# Patient Record
Sex: Female | Born: 1975 | Race: White | Hispanic: No | Marital: Married | Smoking: Never smoker
Health system: Southern US, Community
[De-identification: ages and names within clinical notes are randomized; demographics above are authoritative.]

## PROBLEM LIST (undated history)

## (undated) DIAGNOSIS — K8020 Calculus of gallbladder without cholecystitis without obstruction: Secondary | ICD-10-CM

---

## 2018-09-25 ENCOUNTER — Other Ambulatory Visit: Payer: Self-pay | Admitting: Obstetrics and Gynecology

## 2018-09-25 DIAGNOSIS — Z1231 Encounter for screening mammogram for malignant neoplasm of breast: Secondary | ICD-10-CM

## 2018-11-09 ENCOUNTER — Ambulatory Visit (INDEPENDENT_AMBULATORY_CARE_PROVIDER_SITE_OTHER): Payer: PRIVATE HEALTH INSURANCE | Admitting: Mental Health

## 2018-11-09 ENCOUNTER — Encounter: Payer: Self-pay | Admitting: Mental Health

## 2018-11-09 ENCOUNTER — Encounter (INDEPENDENT_AMBULATORY_CARE_PROVIDER_SITE_OTHER): Payer: Self-pay

## 2018-11-09 DIAGNOSIS — F411 Generalized anxiety disorder: Secondary | ICD-10-CM | POA: Diagnosis not present

## 2018-11-09 DIAGNOSIS — F509 Eating disorder, unspecified: Secondary | ICD-10-CM | POA: Diagnosis not present

## 2018-11-09 NOTE — Progress Notes (Signed)
[facility] Initial Adult Exam- Part I  Name: Kim Wong Date: 11/09/2018 MRN: [id number] DOB: April 27, 1976 PCP: Kim Moccasin, MD  Time spent: 50 minutes   Guardian/Payee: patient   Paperwork requested:  No   Reason for Visit /Presenting Problem:  Anxiety, lack of self care  Mental Status Exam:   Appearance:   Well Groomed     Behavior:  Appropriate and Sharing  Motor:  Normal  Speech/Language:   Normal Rate  Affect:  anxious, overwhelmed  Mood:  anxious  Thought process:  normal  Thought content:    WNL  Sensory/Perceptual disturbances:    WNL  Orientation:  oriented to person, place and time/date  Attention:  Good  Concentration:  Good  Memory:  WNL  Fund of knowledge:   Good  Insight:    Good  Judgment:   Good  Impulse Control:  Fair   Reported Symptoms:  Feelings of Worthlessness, Hopelessness, Appetite disturbance, Fatigue and self rejection  Risk Assessment: Danger to Self:  No Self-injurious Behavior: No Danger to Others: No Duty to Warn:no Physical Aggression / Violence:No  Access to Firearms a concern: No  Gang Involvement:No  Patient / guardian was educated about steps to take if suicide or homicide risk level increases between visits: no While future psychiatric events cannot be accurately predicted, the patient does not currently require acute inpatient psychiatric care and does not currently meet [facility] involuntary commitment criteria.  Substance Abuse History: Current substance abuse: No     Past Psychiatric History:   No previous psychological problems have been observed Outpatient Providers:unknown History of Psych Hospitalization: No  Psychological Testing: none    Medical History/Surgical History:reviewed No past medical history on file.    Medications: No current outpatient medications on file.   No current facility-administered medications for this visit.     Allergies not on file  Diagnoses:   ICD-10-CM   1. Generalized anxiety disorder F41.1   2. Eating disorder, unspecified type F50.9      Part II to be continued at next session:   No.   Kim Wong, LPC        Abuse History: Victim: none Report needed: no Perpetrator of abuse: no Witness / Exposure to Domestic Violence:  none Protective Services Involvement: no Witness to [employer] Violence:  no   Family / Social History:    Living situation: in home with husband and children Sexual Orientation: straight Relationship Status:   Married Name of spouse / other: Kim Wong If a parent, number of children / ages:   Three :  21, 17, 13  Support Systems: Family, Location manager Stress:   routine  Income/Employment/Disability:   Engineer, maintenance (IT): None  Educational History:   Actor online currently  Religion/Sprituality/World View:    Christian  Any cultural differences that may affect / interfere with treatment:  None  Recreation/Hobbies:  None  Stressors:  Obesity, slef concept  Strengths:  Faith, family  Barriers: none  Legal History: None  Pending legal issue / charges: none  History of legal issue / charges: none   Diagnosis:  Generalized Anxiety                    Eating Disorder

## 2018-11-27 ENCOUNTER — Ambulatory Visit: Payer: PRIVATE HEALTH INSURANCE | Admitting: Mental Health

## 2018-12-13 ENCOUNTER — Ambulatory Visit: Payer: PRIVATE HEALTH INSURANCE | Admitting: Mental Health

## 2018-12-17 ENCOUNTER — Ambulatory Visit: Payer: PRIVATE HEALTH INSURANCE | Admitting: Mental Health

## 2019-01-04 ENCOUNTER — Other Ambulatory Visit: Payer: Self-pay

## 2019-01-04 ENCOUNTER — Encounter (HOSPITAL_BASED_OUTPATIENT_CLINIC_OR_DEPARTMENT_OTHER): Payer: Self-pay

## 2019-01-04 ENCOUNTER — Emergency Department (HOSPITAL_BASED_OUTPATIENT_CLINIC_OR_DEPARTMENT_OTHER): Payer: PRIVATE HEALTH INSURANCE

## 2019-01-04 ENCOUNTER — Observation Stay (HOSPITAL_BASED_OUTPATIENT_CLINIC_OR_DEPARTMENT_OTHER)
Admission: EM | Admit: 2019-01-04 | Discharge: 2019-01-05 | Disposition: A | Discharge status: Home / Self Care | Payer: PRIVATE HEALTH INSURANCE | Attending: General Surgery | Admitting: General Surgery

## 2019-01-04 DIAGNOSIS — Z6841 Body Mass Index (BMI) 40.0 and over, adult: Secondary | ICD-10-CM | POA: Insufficient documentation

## 2019-01-04 DIAGNOSIS — K8000 Calculus of gallbladder with acute cholecystitis without obstruction: Principal | ICD-10-CM | POA: Insufficient documentation

## 2019-01-04 DIAGNOSIS — K810 Acute cholecystitis: Secondary | ICD-10-CM

## 2019-01-04 DIAGNOSIS — K8010 Calculus of gallbladder with chronic cholecystitis without obstruction: Secondary | ICD-10-CM | POA: Diagnosis present

## 2019-01-04 DIAGNOSIS — R109 Unspecified abdominal pain: Secondary | ICD-10-CM

## 2019-01-04 DIAGNOSIS — K819 Cholecystitis, unspecified: Secondary | ICD-10-CM

## 2019-01-04 HISTORY — DX: Calculus of gallbladder without cholecystitis without obstruction: K80.20

## 2019-01-04 LAB — COMPREHENSIVE METABOLIC PANEL
ALT: 70 U/L — ABNORMAL HIGH (ref 0–44)
AST: 174 U/L — AB (ref 15–41)
Albumin: 3.6 g/dL (ref 3.5–5.0)
Alkaline Phosphatase: 152 U/L — ABNORMAL HIGH (ref 38–126)
Anion gap: 8 (ref 5–15)
BUN: 10 mg/dL (ref 6–20)
CO2: 21 mmol/L — ABNORMAL LOW (ref 22–32)
Calcium: 8.9 mg/dL (ref 8.9–10.3)
Chloride: 105 mmol/L (ref 98–111)
Creatinine, Ser: 0.66 mg/dL (ref 0.44–1.00)
GFR calc Af Amer: >60 mL/min (ref >60)
GFR calc non Af Amer: >60 mL/min (ref >60)
Glucose, Bld: 150 mg/dL — ABNORMAL HIGH (ref 70–99)
POTASSIUM: 3.4 mmol/L — AB (ref 3.5–5.1)
Sodium: 134 mmol/L — ABNORMAL LOW (ref 135–145)
Total Bilirubin: 0.7 mg/dL (ref 0.3–1.2)
Total Protein: 7.5 g/dL (ref 6.5–8.1)

## 2019-01-04 LAB — CBC WITH DIFFERENTIAL/PLATELET
Abs Immature Granulocytes: 0.03 10*3/uL (ref 0.00–0.07)
Basophils Absolute: 0 10*3/uL (ref 0.0–0.1)
Basophils Relative: 0 %
EOS ABS: 0 10*3/uL (ref 0.0–0.5)
Eosinophils Relative: 0 %
HEMATOCRIT: 38.6 % (ref 36.0–46.0)
Hemoglobin: 12 g/dL (ref 12.0–15.0)
Immature Granulocytes: 0 %
LYMPHS ABS: 1.1 10*3/uL (ref 0.7–4.0)
Lymphocytes Relative: 10 %
MCH: 25.4 pg — ABNORMAL LOW (ref 26.0–34.0)
MCHC: 31.1 g/dL (ref 30.0–36.0)
MCV: 81.6 fL (ref 80.0–100.0)
MONOS PCT: 3 %
Monocytes Absolute: 0.3 10*3/uL (ref 0.1–1.0)
Neutro Abs: 9.2 10*3/uL — ABNORMAL HIGH (ref 1.7–7.7)
Neutrophils Relative %: 87 %
Platelets: 305 10*3/uL (ref 150–400)
RBC: 4.73 MIL/uL (ref 3.87–5.11)
RDW: 14.8 % (ref 11.5–15.5)
WBC: 10.7 10*3/uL — ABNORMAL HIGH (ref 4.0–10.5)
nRBC: 0 % (ref 0.0–0.2)

## 2019-01-04 LAB — LIPASE, BLOOD: Lipase: 20 U/L (ref 11–51)

## 2019-01-04 LAB — URINALYSIS, ROUTINE W REFLEX MICROSCOPIC
Bilirubin Urine: NEGATIVE
Glucose, UA: NEGATIVE mg/dL
Hgb urine dipstick: NEGATIVE
Ketones, ur: NEGATIVE mg/dL
Nitrite: NEGATIVE
Protein, ur: NEGATIVE mg/dL
Specific Gravity, Urine: 1.015 (ref 1.005–1.030)
pH: 8.5 — ABNORMAL HIGH (ref 5.0–8.0)

## 2019-01-04 LAB — URINALYSIS, MICROSCOPIC (REFLEX)

## 2019-01-04 LAB — PREGNANCY, URINE: Preg Test, Ur: NEGATIVE

## 2019-01-04 MED ORDER — FENTANYL CITRATE (PF) 100 MCG/2ML IJ SOLN: 100.0000 ug | Freq: Once | INTRAMUSCULAR | Status: DC

## 2019-01-04 MED ORDER — ONDANSETRON HCL 4 MG/2ML IJ SOLN
4.0000 mg | Freq: Once | INTRAMUSCULAR | Status: AC | PRN
  Administered 2019-01-04: 4 mg via INTRAVENOUS
  Filled 2019-01-04: qty 2

## 2019-01-04 MED ORDER — SODIUM CHLORIDE 0.9 % IV SOLN
2.0000 g | Freq: Once | INTRAVENOUS | Status: AC
  Administered 2019-01-04: 2 g via INTRAVENOUS
  Filled 2019-01-04: qty 20

## 2019-01-04 MED ORDER — SODIUM CHLORIDE 0.9 % IV SOLN
INTRAVENOUS | Status: DC | PRN
  Administered 2019-01-04: 250 mL via INTRAVENOUS

## 2019-01-04 NOTE — ED Provider Notes (Signed)
[facility] EMERGENCY DEPARTMENT Provider Note   CSN: [id number] Arrival date & time: 01/04/19  [place]    History   Chief Complaint Chief Complaint  Patient presents with  . Abdominal Pain    HPI Kim Wong is a 43 y.o. female medical history of gallstones who presents for evaluation of right upper quadrant abdominal pain that began about 3 PM.  Patient with a known history of gallstones.  She states she will occasionally have intermittent attacks but states that they are usually manageable.  She states that about 3 PM this evening, she started developing some right upper quadrant abdominal pain.  She states that this was worse than her previous pain.  She took ibuprofen no improvement.  She took a 1 Vicodin that she had leftover.  She states that this brought her pain down from a 9 to about a 7.  She states she called the telemedicine service who prompted her to the emergency department for further evaluation.  Patient reports that on her way here, she had 3 episodes of nonbloody, nonbilious vomiting.  He states that currently her pain is 4/10.  Patient states she has not had any fevers.  Patient denies any chest pain, difficulty breathing, dysuria, hematuria.     The history is provided by the patient.    Past Medical History:  Diagnosis Date  . Gallstones     There are no active problems to display for this patient.   History reviewed. No pertinent surgical history.   OB History   No obstetric history on file.      Home Medications    Prior to Admission medications   Not on File    Family History No family history on file.  Social History Social History   Tobacco Use  . Smoking status: Never Smoker  . Smokeless tobacco: Never Used  Substance Use Topics  . Alcohol use: Never    Frequency: Never  . Drug use: Never     Allergies   Patient has no known allergies.   Review of Systems Review of Systems  Constitutional: Negative for fever.   Respiratory: Negative for cough and shortness of breath.   Cardiovascular: Negative for chest pain.  Gastrointestinal: Positive for abdominal pain, nausea and vomiting.  Genitourinary: Negative for dysuria and hematuria.  Neurological: Negative for headaches.  All other systems reviewed and are negative.    Physical Exam Updated Vital Signs BP (!) 154/102   Pulse 88   Temp 98.6 F (37 C) (Oral)   Resp 18   Ht _0  (1.676 m)   Wt (!) 154.2 kg   LMP 12/15/2018   SpO2 99%   BMI 54.88 kg/m   Physical Exam Vitals signs and nursing note reviewed.  Constitutional:      Appearance: Normal appearance. She is well-developed.  HENT:     Head: Normocephalic and atraumatic.  Eyes:     General: Lids are normal.     Conjunctiva/sclera: Conjunctivae normal.     Pupils: Pupils are equal, round, and reactive to light.  Neck:     Musculoskeletal: Full passive range of motion without pain.  Cardiovascular:     Rate and Rhythm: Normal rate and regular rhythm.     Pulses: Normal pulses.     Heart sounds: Normal heart sounds. No murmur. No friction rub. No gallop.   Pulmonary:     Effort: Pulmonary effort is normal.     Breath sounds: Normal breath sounds.  Comments: Lungs clear to auscultation bilaterally.  Symmetric chest rise.  No wheezing, rales, rhonchi. Abdominal:     Palpations: Abdomen is soft. Abdomen is not rigid.     Tenderness: There is abdominal tenderness in the right upper quadrant. There is no guarding. Positive signs include Murphy's sign.     Comments: Tenderness noted to right upper quadrant.  Positive Murphy sign.  No tenderness McBurney's point.  No CVA tenderness bilaterally.  Musculoskeletal: Normal range of motion.  Skin:    General: Skin is warm and dry.     Capillary Refill: Capillary refill takes less than 2 seconds.  Neurological:     Mental Status: She is alert and oriented to person, place, and time.  Psychiatric:        Speech: Speech normal.       ED Treatments / Results  Labs (all labs ordered are listed, but only abnormal results are displayed) Labs Reviewed  COMPREHENSIVE METABOLIC PANEL - Abnormal; Notable for the following components:      Result Value   Sodium 134 (*)    Potassium 3.4 (*)    CO2 21 (*)    Glucose, Bld 150 (*)    AST 174 (*)    ALT 70 (*)    Alkaline Phosphatase 152 (*)    All other components within normal limits  URINALYSIS, ROUTINE W REFLEX MICROSCOPIC - Abnormal; Notable for the following components:   pH 8.5 (*)    Leukocytes,Ua TRACE (*)    All other components within normal limits  CBC WITH DIFFERENTIAL/PLATELET - Abnormal; Notable for the following components:   WBC 10.7 (*)    MCH 25.4 (*)    Neutro Abs 9.2 (*)    All other components within normal limits  URINALYSIS, MICROSCOPIC (REFLEX) - Abnormal; Notable for the following components:   Bacteria, UA MANY (*)    All other components within normal limits  LIPASE, BLOOD  PREGNANCY, URINE    EKG None  Radiology US Abdomen Limited Ruq  Result Date: 01/04/2019 CLINICAL DATA:  Epigastric and right upper quadrant pain today. Nausea and vomiting. Known gallstones. EXAM: ULTRASOUND ABDOMEN LIMITED RIGHT UPPER QUADRANT COMPARISON:  None. FINDINGS: Gallbladder: Large shadowing stone in the gallbladder fundus measuring 2.6 cm. Mild gallbladder wall thickening of 3 mm. Small amount pericholecystic fluid. Positive sonographic Murphy sign noted by sonographer. Common bile duct: Diameter: 2 mm. Liver: No focal lesion identified. Within normal limits in parenchymal echogenicity. Portal vein is patent on color Doppler imaging with normal direction of blood flow towards the liver. IMPRESSION: Gallstone in the fundus with mild gallbladder wall thickening, small amount pericholecystic fluid, and positive sonographic Murphy sign. Sonographic findings consistent with acute cholecystitis. No biliary dilatation. Electronically Signed   By: Keith Rake M.D.    On: 01/04/2019 20:18    Procedures Procedures (including critical care time)  Medications Ordered in ED Medications  fentaNYL (SUBLIMAZE) injection 100 mcg (has no administration in time range)  cefTRIAXone (ROCEPHIN) 2 g in sodium chloride 0.9 % 100 mL IVPB (2 g Intravenous New Bag/Given 01/04/19 2047)  0.9 %  sodium chloride infusion (250 mLs Intravenous New Bag/Given 01/04/19 2043)  ondansetron (ZOFRAN) injection 4 mg (4 mg Intravenous Given 01/04/19 2044)     Initial Impression / Assessment and Plan / ED Course  I have reviewed the triage vital signs and the nursing notes.  Pertinent labs & imaging results that were available during my care of the patient were reviewed by me and considered  in my medical decision making (see chart for details).        43 year old female who presents for evaluation of right upper quadrant abdominal pain that began 3 PM this evening.  Patient with known history of gallstones and states she will intermittently have attacks but usually they resolve.  She reports that this seemed more severe.  Also reports some vomiting.  No fevers.  No urinary complaints, chest pain, difficulty breathing. Patient is afebrile, non-toxic appearing, sitting comfortably on examination table. Vital signs reviewed and stable.  Exam, patient with tenderness palpation to the right upper quadrant.  Consider hepatobiliary allergy versus infectious etiology.  We will plan to check labs.  Urine pregnancy negative.  CBC shows leukocytosis of 10.7.  Lipase unremarkable.  CMP shows potassium of 3.4. AST is 174. ALT is 70. Alk Phos is 152. T. Bili is 0.7.  Shows trace hemoglobin.   US shows gallstone in the fundus with mild gallbladder wall thickening. Small amount of pericholecystic fluid and positive sonography Murphy sign.  No biliary dilatation. Findings consistent with acute cholecystitis.    Discussed results with patient. Patient started on Rocephin here in the ED.   Discussed  patient with Dr. Marlou Starks (Gen Surg). He would like patient transferred over to the [facility] ED where he will evaluate patient.   Dr. Laverta Baltimore accepts patient for transfer to the ED. Updated patient on plan. She is agreeable.   Portions of this note were generated with [employer]. Dictation errors may occur despite best attempts at proofreading.   Final Clinical Impressions(s) / ED Diagnoses   Final diagnoses:  Abdominal pain  Acute cholecystitis    ED Discharge Orders    None       Desma Mcgregor 01/04/19 2115    Jola Schmidt, MD 01/04/19 2208

## 2019-01-04 NOTE — H&P (Signed)
Kim Wong is an 43 y.o. female.   Chief Complaint: abd pain HPI: The patient is a 43 year old white female who presents with right upper quadrant pain that started yesterday.  She states that she has known she has had gallstones for at least a couple of years.  She describes the pain is severe.  The pain is been associated with an episode of nausea and vomiting.  She says that she is starting to feel better now.  She denies any fevers or chills.  Her ultrasound showed a large stone in the fundus of the gallbladder with some gallbladder wall thickening.  2 of her 4 liver functions were elevated  Past Medical History:  Diagnosis Date  . Gallstones     History reviewed. No pertinent surgical history.  No family history on file. Social History:  reports that she has never smoked. She has never used smokeless tobacco. She reports that she does not drink alcohol or use drugs.  Allergies: No Known Allergies  (Not in a hospital admission)   Results for orders placed or performed during the hospital encounter of 01/04/19 (from the past 48 hour(s))  Urinalysis, Routine w reflex microscopic     Status: Abnormal   Collection Time: 01/04/19  7:04 PM  Result Value Ref Range   Color, Urine YELLOW YELLOW   APPearance CLEAR CLEAR   Specific Gravity, Urine 1.015 1.005 - 1.030   pH 8.5 (H) 5.0 - 8.0   Glucose, UA NEGATIVE NEGATIVE mg/dL   Hgb urine dipstick NEGATIVE NEGATIVE   Bilirubin Urine NEGATIVE NEGATIVE   Ketones, ur NEGATIVE NEGATIVE mg/dL   Protein, ur NEGATIVE NEGATIVE mg/dL   Nitrite NEGATIVE NEGATIVE   Leukocytes,Ua TRACE (A) NEGATIVE    Comment: Performed at [facility], [address] [place], [place] [address]  Pregnancy, urine     Status: None   Collection Time: 01/04/19  7:04 PM  Result Value Ref Range   Preg Test, Ur NEGATIVE NEGATIVE    Comment:        THE SENSITIVITY OF THIS METHODOLOGY IS >20 mIU/mL. Performed at [facility], [address] [place], [place] [address]   Urinalysis, Microscopic (reflex)     Status: Abnormal   Collection Time: 01/04/19  7:04 PM  Result Value Ref Range   RBC / HPF 0-5 0 - 5 RBC/hpf   WBC, UA 6-10 0 - 5 WBC/hpf   Bacteria, UA MANY (A) NONE SEEN   Squamous Epithelial / LPF 0-5 0 - 5   Mucus PRESENT     Comment: Performed at [facility], [address] [place], [place] [address]  Lipase, blood     Status: None   Collection Time: 01/04/19  7:37 PM  Result Value Ref Range   Lipase 20 11 - 51 U/L    Comment: Performed at [facility], [address] [place], [place] [address]  Comprehensive metabolic panel     Status: Abnormal   Collection Time: 01/04/19  7:37 PM  Result Value Ref Range   Sodium 134 (L) 135 - 145 mmol/L   Potassium 3.4 (L) 3.5 - 5.1 mmol/L   Chloride 105 98 - 111 mmol/L   CO2 21 (L) 22 - 32 mmol/L   Glucose, Bld 150 (H) 70 - 99 mg/dL   BUN 10 6 - 20 mg/dL   Creatinine, Ser 1.74 0.44 - 1.00 mg/dL   Calcium 8.9 8.9 - 08.1 mg/dL  Total Protein 7.5 6.5 - 8.1 g/dL   Albumin 3.6 3.5 - 5.0 g/dL   AST 438 (H) 15 - 41 U/L   ALT 70 (H) 0 - 44 U/L   Alkaline Phosphatase 152 (H) 38 - 126 U/L   Total Bilirubin 0.7 0.3 - 1.2 mg/dL   GFR calc non Af Amer >60 >60 mL/min   GFR calc Af Amer >60 >60 mL/min   Anion gap 8 5 - 15    Comment: Performed at [facility], [address] [place], [place] [address]  CBC with Differential     Status: Abnormal   Collection Time: 01/04/19  7:37 PM  Result Value Ref Range   WBC 10.7 (H) 4.0 - 10.5 K/uL   RBC 4.73 3.87 - 5.11 MIL/uL   Hemoglobin 12.0 12.0 - 15.0 g/dL   HCT 97.2 82.0 - 60.1 %   MCV 81.6 80.0 - 100.0 fL   MCH 25.4 (L) 26.0 - 34.0 pg   MCHC 31.1 30.0 - 36.0 g/dL   RDW 56.1 53.7 - 94.3 %   Platelets 305 150 - 400 K/uL   nRBC 0.0 0.0 - 0.2 %   Neutrophils Relative % 87 %   Neutro Abs 9.2 (H) 1.7 - 7.7 K/uL   Lymphocytes Relative 10 %   Lymphs Abs 1.1 0.7 - 4.0 K/uL   Monocytes Relative 3 %    Monocytes Absolute 0.3 0.1 - 1.0 K/uL   Eosinophils Relative 0 %   Eosinophils Absolute 0.0 0.0 - 0.5 K/uL   Basophils Relative 0 %   Basophils Absolute 0.0 0.0 - 0.1 K/uL   Immature Granulocytes 0 %   Abs Immature Granulocytes 0.03 0.00 - 0.07 K/uL    Comment: Performed at [facility], [address] [place], [place] [address]   US Abdomen Limited Ruq  Result Date: 01/04/2019 CLINICAL DATA:  Epigastric and right upper quadrant pain today. Nausea and vomiting. Known gallstones. EXAM: ULTRASOUND ABDOMEN LIMITED RIGHT UPPER QUADRANT COMPARISON:  None. FINDINGS: Gallbladder: Large shadowing stone in the gallbladder fundus measuring 2.6 cm. Mild gallbladder wall thickening of 3 mm. Small amount pericholecystic fluid. Positive sonographic Murphy sign noted by sonographer. Common bile duct: Diameter: 2 mm. Liver: No focal lesion identified. Within normal limits in parenchymal echogenicity. Portal vein is patent on color Doppler imaging with normal direction of blood flow towards the liver. IMPRESSION: Gallstone in the fundus with mild gallbladder wall thickening, small amount pericholecystic fluid, and positive sonographic Murphy sign. Sonographic findings consistent with acute cholecystitis. No biliary dilatation. Electronically Signed   By: Narda Rutherford M.D.   On: 01/04/2019 20:18    Review of Systems  Constitutional: Negative.   HENT: Negative.   Eyes: Negative.   Respiratory: Negative.   Cardiovascular: Negative.   Gastrointestinal: Positive for abdominal pain, nausea and vomiting.  Genitourinary: Negative.   Musculoskeletal: Negative.   Skin: Negative.   Neurological: Negative.   Endo/Heme/Allergies: Negative.   Psychiatric/Behavioral: Negative.     Blood pressure (!) 160/92, pulse 85, temperature 98.3 F (36.8 C), temperature source Oral, resp. rate 18, height 5\' 6"  (1.676 m), weight (!) 154.2 kg, last menstrual period 12/15/2018, SpO2 99 %. Physical Exam   Constitutional: She is oriented to person, place, and time. She appears well-developed. No distress.  Obese wf  HENT:  Head: Normocephalic and atraumatic.  Mouth/Throat: No oropharyngeal exudate.  Eyes: Pupils are equal, round, and reactive to light. Conjunctivae and EOM are normal.  Neck:  Normal range of motion. Neck supple.  Cardiovascular: Normal rate, regular rhythm and normal heart sounds.  Respiratory: Effort normal and breath sounds normal. No stridor. No respiratory distress.  GI: Soft. Bowel sounds are normal.  Mild to moderate RUQ tenderness  Musculoskeletal: Normal range of motion.        General: No tenderness or edema.  Neurological: She is alert and oriented to person, place, and time. Coordination normal.  Skin: Skin is warm and dry. No rash noted.  Psychiatric: She has a normal mood and affect. Her behavior is normal. Thought content normal.     Assessment/Plan The patient appears to have cholecystitis with cholelithiasis.  Because of the risk of further painful episodes and possible pancreatitis I think she would benefit from having her gallbladder removed.  I have discussed with her in detail the risks and benefits of the operation as well as some of the technical aspects and she understands.  She is reluctant to agree to surgery right now.  I will plan to admit her and start her on broad-spectrum antibiotic therapy.  She will think about what we have talked about and make her decision when she is ready  Chevis PrettyPaul Toth III, MD 01/04/2019, 11:42 PM

## 2019-01-04 NOTE — ED Triage Notes (Signed)
Hx of gallstones, has been manageable, today the pain got much worse and she has vomited several times. Pt was able to take some vicodin prior to arrival which helped a bit with the pain.

## 2019-01-04 NOTE — ED Notes (Signed)
Report attempted, CN to call back for report, pt to be transport by [facility].

## 2019-01-04 NOTE — ED Notes (Signed)
Phone Report provided to [employer]

## 2019-01-04 NOTE — ED Notes (Signed)
[facility] Team at bedside 

## 2019-01-04 NOTE — ED Notes (Signed)
Called [facility] - requested transport to [facility] ED - Receiving physician is Dr. Jacqulyn Bath

## 2019-01-04 NOTE — ED Provider Notes (Signed)
Blood pressure (!) 154/102, pulse 88, temperature 98.6 F (37 C), temperature source Oral, resp. rate 18, height 5\' 6"  (1.676 m), weight (!) 154.2 kg, last menstrual period 12/15/2018, SpO2 99 %.  In short, Kim Wong is a 43 y.o. female with a chief complaint of Abdominal Pain .  Refer to the original H&P for additional details.  Patient arrived from [facility]. Pain fell controlled. Will page surgery for ED evaluation.   I reviewed all nursing notes, vitals, pertinent old records, EKGs, labs, imaging (as available).     Maia Plan, MD 01/04/19 [phone]

## 2019-01-05 ENCOUNTER — Encounter (HOSPITAL_COMMUNITY)
Admission: EM | Disposition: A | Discharge status: Home / Self Care | Payer: Self-pay | Source: Home / Self Care | Attending: Emergency Medicine

## 2019-01-05 ENCOUNTER — Observation Stay (HOSPITAL_COMMUNITY): Payer: PRIVATE HEALTH INSURANCE | Admitting: Anesthesiology

## 2019-01-05 ENCOUNTER — Observation Stay (HOSPITAL_COMMUNITY): Payer: PRIVATE HEALTH INSURANCE

## 2019-01-05 ENCOUNTER — Encounter (HOSPITAL_COMMUNITY): Payer: Self-pay | Admitting: *Deleted

## 2019-01-05 HISTORY — PX: CHOLECYSTECTOMY: SHX55

## 2019-01-05 LAB — COMPREHENSIVE METABOLIC PANEL
ALT: 82 U/L — ABNORMAL HIGH (ref 0–44)
AST: 113 U/L — ABNORMAL HIGH (ref 15–41)
Albumin: 3.4 g/dL — ABNORMAL LOW (ref 3.5–5.0)
Alkaline Phosphatase: 137 U/L — ABNORMAL HIGH (ref 38–126)
Anion gap: 7 (ref 5–15)
BUN: 9 mg/dL (ref 6–20)
CHLORIDE: 107 mmol/L (ref 98–111)
CO2: 23 mmol/L (ref 22–32)
Calcium: 8.9 mg/dL (ref 8.9–10.3)
Creatinine, Ser: 0.61 mg/dL (ref 0.44–1.00)
GFR calc Af Amer: >60 mL/min (ref >60)
GFR calc non Af Amer: >60 mL/min (ref >60)
Glucose, Bld: 102 mg/dL — ABNORMAL HIGH (ref 70–99)
Potassium: 4.3 mmol/L (ref 3.5–5.1)
Sodium: 137 mmol/L (ref 135–145)
Total Bilirubin: 0.2 mg/dL — ABNORMAL LOW (ref 0.3–1.2)
Total Protein: 6.7 g/dL (ref 6.5–8.1)

## 2019-01-05 LAB — CBC
HCT: 36.3 % (ref 36.0–46.0)
HEMOGLOBIN: 11 g/dL — AB (ref 12.0–15.0)
MCH: 25.8 pg — ABNORMAL LOW (ref 26.0–34.0)
MCHC: 30.3 g/dL (ref 30.0–36.0)
MCV: 85.2 fL (ref 80.0–100.0)
Platelets: 271 10*3/uL (ref 150–400)
RBC: 4.26 MIL/uL (ref 3.87–5.11)
RDW: 14.8 % (ref 11.5–15.5)
WBC: 10.1 10*3/uL (ref 4.0–10.5)
nRBC: 0 % (ref 0.0–0.2)

## 2019-01-05 LAB — SURGICAL PCR SCREEN
MRSA, PCR: NEGATIVE
Staphylococcus aureus: POSITIVE — AB

## 2019-01-05 SURGERY — LAPAROSCOPIC CHOLECYSTECTOMY WITH INTRAOPERATIVE CHOLANGIOGRAM
Anesthesia: General

## 2019-01-05 MED ORDER — ACETAMINOPHEN 10 MG/ML IV SOLN
INTRAVENOUS | Status: DC | PRN
  Administered 2019-01-05: 1000 mg via INTRAVENOUS

## 2019-01-05 MED ORDER — BUPIVACAINE-EPINEPHRINE 0.25% -1:200000 IJ SOLN
INTRAMUSCULAR | Status: DC | PRN
  Administered 2019-01-05: 30 mL

## 2019-01-05 MED ORDER — SODIUM CHLORIDE 0.9 % IR SOLN
Status: DC | PRN
  Administered 2019-01-05: 1000 mL

## 2019-01-05 MED ORDER — ACETAMINOPHEN 10 MG/ML IV SOLN
INTRAVENOUS | Status: AC
  Filled 2019-01-05: qty 100

## 2019-01-05 MED ORDER — ROCURONIUM BROMIDE 100 MG/10ML IV SOLN
INTRAVENOUS | Status: DC | PRN
  Administered 2019-01-05: 10 mg via INTRAVENOUS
  Administered 2019-01-05: 25 mg via INTRAVENOUS

## 2019-01-05 MED ORDER — PROPOFOL 10 MG/ML IV BOLUS
INTRAVENOUS | Status: DC | PRN
  Administered 2019-01-05: 200 mg via INTRAVENOUS

## 2019-01-05 MED ORDER — MIDAZOLAM HCL 5 MG/5ML IJ SOLN
INTRAMUSCULAR | Status: DC | PRN
  Administered 2019-01-05 (×2): 1 mg via INTRAVENOUS

## 2019-01-05 MED ORDER — FENTANYL CITRATE (PF) 100 MCG/2ML IJ SOLN: 25.0000 ug | INTRAMUSCULAR | Status: DC | PRN

## 2019-01-05 MED ORDER — ONDANSETRON HCL 4 MG/2ML IJ SOLN: 4.0000 mg | Freq: Four times a day (QID) | INTRAMUSCULAR | Status: DC | PRN

## 2019-01-05 MED ORDER — KETOROLAC TROMETHAMINE 30 MG/ML IJ SOLN
30.0000 mg | Freq: Four times a day (QID) | INTRAMUSCULAR | Status: DC | PRN
  Administered 2019-01-05: 30 mg via INTRAVENOUS

## 2019-01-05 MED ORDER — BUPIVACAINE-EPINEPHRINE (PF) 0.25% -1:200000 IJ SOLN
INTRAMUSCULAR | Status: AC
  Filled 2019-01-05: qty 30

## 2019-01-05 MED ORDER — OXYCODONE HCL 5 MG PO TABS: 5.0000 mg | ORAL_TABLET | ORAL | Status: DC | PRN

## 2019-01-05 MED ORDER — SODIUM CHLORIDE 0.9 % IV SOLN: INTRAVENOUS | Status: DC

## 2019-01-05 MED ORDER — ONDANSETRON 4 MG PO TBDP: 4.0000 mg | ORAL_TABLET | Freq: Four times a day (QID) | ORAL | Status: DC | PRN

## 2019-01-05 MED ORDER — IOHEXOL 300 MG/ML  SOLN
INTRAMUSCULAR | Status: DC | PRN
  Administered 2019-01-05: 5 mL

## 2019-01-05 MED ORDER — SCOPOLAMINE 1 MG/3DAYS TD PT72
1.0000 | MEDICATED_PATCH | TRANSDERMAL | Status: DC
  Administered 2019-01-05: 1.5 mg via TRANSDERMAL

## 2019-01-05 MED ORDER — ROCURONIUM BROMIDE 100 MG/10ML IV SOLN
INTRAVENOUS | Status: AC
  Filled 2019-01-05: qty 1

## 2019-01-05 MED ORDER — PROPOFOL 10 MG/ML IV BOLUS
INTRAVENOUS | Status: AC
  Filled 2019-01-05: qty 20

## 2019-01-05 MED ORDER — ONDANSETRON HCL 4 MG/2ML IJ SOLN
INTRAMUSCULAR | Status: DC | PRN
  Administered 2019-01-05: 4 mg via INTRAVENOUS

## 2019-01-05 MED ORDER — KCL IN DEXTROSE-NACL 20-5-0.9 MEQ/L-%-% IV SOLN
INTRAVENOUS | Status: DC
  Administered 2019-01-05: 01:00:00 via INTRAVENOUS
  Filled 2019-01-05 (×2): qty 1000

## 2019-01-05 MED ORDER — LIP MEDEX EX OINT
TOPICAL_OINTMENT | CUTANEOUS | Status: AC
  Filled 2019-01-05: qty 7

## 2019-01-05 MED ORDER — 0.9 % SODIUM CHLORIDE (POUR BTL) OPTIME
TOPICAL | Status: DC | PRN
  Administered 2019-01-05: 1000 mL

## 2019-01-05 MED ORDER — LACTATED RINGERS IV SOLN
INTRAVENOUS | Status: DC | PRN
  Administered 2019-01-05 (×2): via INTRAVENOUS

## 2019-01-05 MED ORDER — TRAMADOL HCL 50 MG PO TABS: 50.0000 mg | ORAL_TABLET | Freq: Four times a day (QID) | ORAL | Status: DC | PRN

## 2019-01-05 MED ORDER — FENTANYL CITRATE (PF) 250 MCG/5ML IJ SOLN
INTRAMUSCULAR | Status: AC
  Filled 2019-01-05: qty 5

## 2019-01-05 MED ORDER — ACETAMINOPHEN 10 MG/ML IV SOLN: 1000.0000 mg | Freq: Once | INTRAVENOUS | Status: DC

## 2019-01-05 MED ORDER — DEXAMETHASONE SODIUM PHOSPHATE 10 MG/ML IJ SOLN
INTRAMUSCULAR | Status: DC | PRN
  Administered 2019-01-05: 5 mg via INTRAVENOUS

## 2019-01-05 MED ORDER — ESMOLOL HCL 100 MG/10ML IV SOLN
INTRAVENOUS | Status: DC | PRN
  Administered 2019-01-05: 15 mg via INTRAVENOUS

## 2019-01-05 MED ORDER — SODIUM CHLORIDE 0.9 % IV SOLN
INTRAVENOUS | Status: AC
  Filled 2019-01-05: qty 20

## 2019-01-05 MED ORDER — DEXAMETHASONE SODIUM PHOSPHATE 10 MG/ML IJ SOLN
INTRAMUSCULAR | Status: AC
  Filled 2019-01-05: qty 1

## 2019-01-05 MED ORDER — MIDAZOLAM HCL 2 MG/2ML IJ SOLN
INTRAMUSCULAR | Status: AC
  Filled 2019-01-05: qty 2

## 2019-01-05 MED ORDER — FENTANYL CITRATE (PF) 100 MCG/2ML IJ SOLN
INTRAMUSCULAR | Status: DC | PRN
  Administered 2019-01-05 (×3): 50 ug via INTRAVENOUS
  Administered 2019-01-05: 100 ug via INTRAVENOUS

## 2019-01-05 MED ORDER — ESMOLOL HCL 100 MG/10ML IV SOLN
INTRAVENOUS | Status: AC
  Filled 2019-01-05: qty 10

## 2019-01-05 MED ORDER — SCOPOLAMINE 1 MG/3DAYS TD PT72
MEDICATED_PATCH | TRANSDERMAL | Status: AC
  Filled 2019-01-05: qty 1

## 2019-01-05 MED ORDER — ONDANSETRON HCL 4 MG/2ML IJ SOLN
INTRAMUSCULAR | Status: AC
  Filled 2019-01-05: qty 2

## 2019-01-05 MED ORDER — KETOROLAC TROMETHAMINE 30 MG/ML IJ SOLN
INTRAMUSCULAR | Status: AC
  Filled 2019-01-05: qty 1

## 2019-01-05 MED ORDER — SUGAMMADEX SODIUM 500 MG/5ML IV SOLN
INTRAVENOUS | Status: DC | PRN
  Administered 2019-01-05: 400 mg via INTRAVENOUS

## 2019-01-05 MED ORDER — SODIUM CHLORIDE 0.9 % IV SOLN
2.0000 g | INTRAVENOUS | Status: DC
  Administered 2019-01-05: 2 g via INTRAVENOUS

## 2019-01-05 MED ORDER — PANTOPRAZOLE SODIUM 40 MG IV SOLR
40.0000 mg | Freq: Every day | INTRAVENOUS | Status: DC
  Administered 2019-01-05: 40 mg via INTRAVENOUS
  Filled 2019-01-05: qty 40

## 2019-01-05 MED ORDER — LIDOCAINE HCL (CARDIAC) PF 100 MG/5ML IV SOSY
PREFILLED_SYRINGE | INTRAVENOUS | Status: DC | PRN
  Administered 2019-01-05: 60 mg via INTRAVENOUS

## 2019-01-05 MED ORDER — SUCCINYLCHOLINE CHLORIDE 20 MG/ML IJ SOLN
INTRAMUSCULAR | Status: DC | PRN
  Administered 2019-01-05: 200 mg via INTRAVENOUS

## 2019-01-05 MED ORDER — MORPHINE SULFATE (PF) 2 MG/ML IV SOLN: 1.0000 mg | INTRAVENOUS | Status: DC | PRN

## 2019-01-05 SURGICAL SUPPLY — 29 items
APPLIER CLIP 5 13 M/L LIGAMAX5 (MISCELLANEOUS) ×2
CABLE HIGH FREQUENCY MONO STRZ (ELECTRODE) ×2 IMPLANT
CATH REDDICK CHOLANGI 4FR 50CM (CATHETERS) ×2 IMPLANT
CHLORAPREP W/TINT 26 (MISCELLANEOUS) ×2 IMPLANT
CLIP APPLIE 5 13 M/L LIGAMAX5 (MISCELLANEOUS) ×1 IMPLANT
COVER MAYO STAND STRL (DRAPES) ×2 IMPLANT
COVER WAND RF STERILE (DRAPES) IMPLANT
DECANTER SPIKE VIAL GLASS SM (MISCELLANEOUS) ×2 IMPLANT
DERMABOND ADVANCED (GAUZE/BANDAGES/DRESSINGS) ×1
DERMABOND ADVANCED .7 DNX12 (GAUZE/BANDAGES/DRESSINGS) ×1 IMPLANT
DRAPE C-ARM 42X120 X-RAY (DRAPES) ×2 IMPLANT
ELECT REM PT RETURN 15FT ADLT (MISCELLANEOUS) ×2 IMPLANT
GLOVE BIO SURGEON STRL SZ7.5 (GLOVE) ×2 IMPLANT
GOWN STRL REUS W/TWL XL LVL3 (GOWN DISPOSABLE) ×6 IMPLANT
HEMOSTAT SURGICEL 4X8 (HEMOSTASIS) IMPLANT
IV CATH 14GX2 1/4 (CATHETERS) ×2 IMPLANT
KIT BASIN OR (CUSTOM PROCEDURE TRAY) ×2 IMPLANT
KIT TURNOVER KIT A (KITS) IMPLANT
POUCH SPECIMEN RETRIEVAL 10MM (ENDOMECHANICALS) ×2 IMPLANT
SCISSORS LAP 5X35 DISP (ENDOMECHANICALS) ×2 IMPLANT
SET IRRIG TUBING LAPAROSCOPIC (IRRIGATION / IRRIGATOR) ×2 IMPLANT
SET TUBE SMOKE EVAC HIGH FLOW (TUBING) ×2 IMPLANT
SLEEVE XCEL OPT CAN 5 100 (ENDOMECHANICALS) ×4 IMPLANT
SUT MNCRL AB 4-0 PS2 18 (SUTURE) ×2 IMPLANT
TOWEL OR 17X26 10 PK STRL BLUE (TOWEL DISPOSABLE) ×2 IMPLANT
TOWEL OR NON WOVEN STRL DISP B (DISPOSABLE) ×2 IMPLANT
TRAY LAPAROSCOPIC (CUSTOM PROCEDURE TRAY) ×2 IMPLANT
TROCAR BLADELESS OPT 5 100 (ENDOMECHANICALS) ×2 IMPLANT
TROCAR XCEL BLUNT TIP 100MML (ENDOMECHANICALS) ×2 IMPLANT

## 2019-01-05 NOTE — Transfer of Care (Signed)
Immediate Anesthesia Transfer of Care Note  Patient: Kim Wong  Procedure(s) Performed: LAPAROSCOPIC CHOLECYSTECTOMY WITH INTRAOPERATIVE CHOLANGIOGRAM (N/A )  Patient Location: PACU  Anesthesia Type:General  Level of Consciousness: awake, alert  and oriented  Airway & Oxygen Therapy: Patient Spontanous Breathing and Patient connected to face mask oxygen  Post-op Assessment: Report given to RN and Post -op Vital signs reviewed and stable  Post vital signs: Reviewed and stable  Last Vitals:  Vitals Value Taken Time  BP 141/77 01/05/2019 10:03 AM  Temp    Pulse 84 01/05/2019 10:04 AM  Resp 14 01/05/2019 10:04 AM  SpO2 100 % 01/05/2019 10:04 AM  Vitals shown include unvalidated device data.  Last Pain:  Vitals:   01/05/19 0054  TempSrc: Oral  PainSc:          Complications: No apparent anesthesia complications

## 2019-01-05 NOTE — Anesthesia Preprocedure Evaluation (Addendum)
Anesthesia Evaluation  Patient identified by MRN, date of birth, ID band Patient awake    Reviewed: Allergy & Precautions, NPO status , Patient's Chart, lab work & pertinent test results  Airway Mallampati: I  TM Distance: >3 FB Neck ROM: Full    Dental no notable dental hx. (+) Teeth Intact, Dental Advisory Given   Pulmonary neg pulmonary ROS,    Pulmonary exam normal breath sounds clear to auscultation       Cardiovascular negative cardio ROS Normal cardiovascular exam Rhythm:Regular Rate:Normal     Neuro/Psych negative neurological ROS  negative psych ROS   GI/Hepatic negative GI ROS, Neg liver ROS,   Endo/Other  Morbid obesity  Renal/GU negative Renal ROS  negative genitourinary   Musculoskeletal negative musculoskeletal ROS (+)   Abdominal   Peds  Hematology negative hematology ROS (+)   Anesthesia Other Findings   Reproductive/Obstetrics negative OB ROS                           Anesthesia Physical Anesthesia Plan  ASA: III  Anesthesia Plan: General   Post-op Pain Management:    Induction: Intravenous  PONV Risk Score and Plan: 3 and Ondansetron, Dexamethasone, Midazolam and Scopolamine patch - Pre-op  Airway Management Planned: Oral ETT and Video Laryngoscope Planned  Additional Equipment:   Intra-op Plan:   Post-operative Plan: Extubation in OR  Informed Consent: I have reviewed the patients History and Physical, chart, labs and discussed the procedure including the risks, benefits and alternatives for the proposed anesthesia with the patient or authorized representative who has indicated his/her understanding and acceptance.     Dental advisory given  Plan Discussed with: CRNA  Anesthesia Plan Comments:        Anesthesia Quick Evaluation

## 2019-01-05 NOTE — ED Notes (Signed)
Pt resting in bed. Awaiting admission. No acute distress or questions at this time

## 2019-01-05 NOTE — ED Notes (Signed)
Pt in room from [facility] transfer. VS rechecked. Pt in no acute distress, not asking for pain meds or nausea meds. Awaiting provider and admission

## 2019-01-05 NOTE — Progress Notes (Signed)
Pt received from PACU. VS stable, denies pain. Will continue to monitor

## 2019-01-05 NOTE — Discharge Instructions (Signed)
[facility] ______[facility] °LAPAROSCOPIC SURGERY: POST OP INSTRUCTIONS °Always review your discharge instruction sheet given to you by the facility where your surgery was performed. °IF YOU HAVE DISABILITY OR FAMILY LEAVE FORMS, YOU MUST BRING THEM TO THE OFFICE FOR PROCESSING.   °DO NOT GIVE THEM TO YOUR DOCTOR. ° °1. A prescription for pain medication may be given to you upon discharge.  Take your pain medication as prescribed, if needed.  If narcotic pain medicine is not needed, then you may take acetaminophen (Tylenol) or ibuprofen (Advil) as needed. °2. Take your usually prescribed medications unless otherwise directed. °3. If you need a refill on your pain medication, please contact your pharmacy.  They will contact our office to request authorization. Prescriptions will not be filled after 5pm or on week-ends. °4. You should follow a light diet the first few days after arrival home, such as soup and crackers, etc.  Be sure to include lots of fluids daily. °5. Most patients will experience some swelling and bruising in the area of the incisions.  Ice packs will help.  Swelling and bruising can take several days to resolve.  °6. It is common to experience some constipation if taking pain medication after surgery.  Increasing fluid intake and taking a stool softener (such as Colace) will usually help or prevent this problem from occurring.  A mild laxative (Milk of Magnesia or Miralax) should be taken according to package instructions if there are no bowel movements after 48 hours. °7. Unless discharge instructions indicate otherwise, you may remove your bandages 24-48 hours after surgery, and you may shower at that time.  You may have steri-strips (small skin tapes) in place directly over the incision.  These strips should be left on the skin for 7-10 days.  If your surgeon used skin glue on the incision, you may shower in 24 hours.  The glue will flake off over the next 2-3 weeks.  Any sutures or  staples will be removed at the office during your follow-up visit. °8. ACTIVITIES:  You may resume regular (light) daily activities beginning the next day--such as daily self-care, walking, climbing stairs--gradually increasing activities as tolerated.  You may have sexual intercourse when it is comfortable.  Refrain from any heavy lifting or straining until approved by your doctor. °a. You may drive when you are no longer taking prescription pain medication, you can comfortably wear a seatbelt, and you can safely maneuver your car and apply brakes. °b. RETURN TO WORK:  __________________________________________________________ °9. You should see your doctor in the office for a follow-up appointment approximately 2-3 weeks after your surgery.  Make sure that you call for this appointment within a day or two after you arrive home to insure a convenient appointment time. °10. OTHER INSTRUCTIONS: __________________________________________________________________________________________________________________________ __________________________________________________________________________________________________________________________ °WHEN TO CALL YOUR DOCTOR: °1. Fever over 101.0 °2. Inability to urinate °3. Continued bleeding from incision. °4. Increased pain, redness, or drainage from the incision. °5. Increasing abdominal pain ° °The clinic staff is available to answer your questions during regular business hours.  Please don’t hesitate to call and ask to speak to one of the nurses for clinical concerns.  If you have a medical emergency, go to the nearest emergency room or call 911.  A surgeon from [facility] is always on call at the hospital. °[address], [address], [place], [place]  [address] ? [address] [address], [place], [place]   [address] °[phone] ? [phone] ? FAX [phone] °Web site:   [email or web address] °

## 2019-01-05 NOTE — Anesthesia Procedure Notes (Signed)
Procedure Name: Intubation Date/Time: 01/05/2019 8:05 AM Performed by: Garth Bigness, CRNA Pre-anesthesia Checklist: Patient identified, Suction available, Emergency Drugs available, Patient being monitored and Timeout performed Patient Re-evaluated:Patient Re-evaluated prior to induction Oxygen Delivery Method: Circle system utilized Preoxygenation: Pre-oxygenation with 100% oxygen Induction Type: IV induction, Cricoid Pressure applied and Rapid sequence Ventilation: Mask ventilation without difficulty Grade View: Grade I Tube type: Oral Tube size: 7.0 mm Number of attempts: 1 Airway Equipment and Method: Video-laryngoscopy Placement Confirmation: ETT inserted through vocal cords under direct vision,  positive ETCO2 and breath sounds checked- equal and bilateral Secured at: 21 cm Tube secured with: Tape

## 2019-01-05 NOTE — Op Note (Signed)
01/04/2019 - 01/05/2019  9:48 AM  PATIENT:  Kim Wong  43 y.o. female  PRE-OPERATIVE DIAGNOSIS:  Acute cholecystitis with cholelithiasis  POST-OPERATIVE DIAGNOSIS:  Acute cholecystitis with cholelithiasis  PROCEDURE:  Procedure(s): LAPAROSCOPIC CHOLECYSTECTOMY WITH INTRAOPERATIVE CHOLANGIOGRAM (N/A)  SURGEON:  Surgeon(s) and Role:    Griselda Miner, MD - Primary    * Axel Filler, MD - Assisting  PHYSICIAN ASSISTANT:   ASSISTANTS: Dr. Derrell Lolling   ANESTHESIA:   local and general  EBL:  10 mL   BLOOD ADMINISTERED:none  DRAINS: none   LOCAL MEDICATIONS USED:  MARCAINE     SPECIMEN:  Source of Specimen:  gallbladder  DISPOSITION OF SPECIMEN:  PATHOLOGY  COUNTS:  YES  TOURNIQUET:  * No tourniquets in log *  DICTATION: .Dragon Dictation    Procedure: After informed consent was obtained the patient was brought to the operating room and placed in the supine position on the operating room table. After adequate induction of general anesthesia the patient's abdomen was prepped with ChloraPrep allowed to dry and draped in usual sterile manner. An appropriate timeout was performed. The area below the umbilicus was infiltrated with quarter percent  Marcaine. A small incision was made with a 15 blade knife. The incision was carried down through the subcutaneous tissue bluntly with a hemostat and [employer] retractors. The linea alba was identified. The linea alba was incised with a 15 blade knife and each side was grasped with Coker clamps. The preperitoneal space was then probed with a hemostat until the peritoneum was opened and access was gained to the abdominal cavity. A 0 Vicryl pursestring stitch was placed in the fascia surrounding the opening. A Hassan cannula was then placed through the opening and anchored in place with the previously placed Vicryl purse string stitch. The abdomen was insufflated with carbon dioxide without difficulty. A laparoscope was inserted through the  [facility] cannula in the right upper quadrant was inspected. Next the epigastric region was infiltrated with % Marcaine. A small incision was made with a 15 blade knife. A 5 mm port was placed bluntly through this incision into the abdominal cavity under direct vision. Next 2 sites were chosen laterally on the right side of the abdomen for placement of 5 mm ports. Each of these areas was infiltrated with quarter percent Marcaine. Small stab incisions were made with a 15 blade knife. 5 mm ports were then placed bluntly through these incisions into the abdominal cavity under direct vision without difficulty. A blunt grasper was placed through the lateralmost 5 mm port and used to grasp the dome of the gallbladder and elevated anteriorly and superiorly. Another blunt grasper was placed through the other 5 mm port and used to retract the body and neck of the gallbladder. A dissector was placed through the epigastric port and using the electrocautery the peritoneal reflection at the gallbladder neck was opened. Blunt dissection was then carried out in this area until the gallbladder neck-cystic duct junction was readily identified and a good window was created. A single clip was placed on the gallbladder neck. A small  ductotomy was made just below the clip with laparoscopic scissors. A 14-gauge Angiocath was then placed through the anterior abdominal wall under direct vision. A Reddick cholangiogram catheter was then placed through the Angiocath and flushed. The catheter was then placed in the cystic duct and anchored in place with a clip. A cholangiogram was obtained that showed no filling defects good emptying into the duodenum an adequate length on  the cystic duct. The anchoring clip and catheters were then removed from the patient. 3 clips were placed proximally on the cystic duct and the duct was divided between the 2 sets of clips. Posterior to this the cystic artery was identified and again dissected bluntly in a  circumferential manner until a good window  was created. 2 clips were placed proximally and one distally on the artery and the artery was divided between the 2 sets of clips. Next a laparoscopic hook cautery device was used to separate the gallbladder from the liver bed. Prior to completely detaching the gallbladder from the liver bed the liver bed was inspected and several small bleeding points were coagulated with the electrocautery until the area was completely hemostatic. The gallbladder was then detached the rest of it from the liver bed without difficulty. A laparoscopic bag was inserted through the hassan port. The laparoscope was moved to the epigastric port. The gallbladder was placed within the bag and the bag was sealed.  The bag with the gallbladder was then removed with the [facility] cannula through the infraumbilical port without difficulty. The fascial defect was then closed with the previously placed Vicryl pursestring stitch as well as with another figure-of-eight 0 Vicryl stitch. The liver bed was inspected again and found to be hemostatic. The abdomen was irrigated with copious amounts of saline until the effluent was clear. The ports were then removed under direct vision without difficulty and were found to be hemostatic. The gas was allowed to escape. The skin incisions were all closed with interrupted 4-0 Monocryl subcuticular stitches. Dermabond dressings were applied. The patient tolerated the procedure well. At the end of the case all needle sponge and instrument counts were correct. The patient was then awakened and taken to recovery in stable condition. The assistant was crucial for visualization and retraction during the case  PLAN OF CARE: Admit to inpatient   PATIENT DISPOSITION:  PACU - hemodynamically stable.   Delay start of Pharmacological VTE agent (>24hrs) due to surgical blood loss or risk of bleeding: no

## 2019-01-05 NOTE — ED Notes (Signed)
ED TO INPATIENT HANDOFF REPORT  ED Nurse Name and Phone #: Charissa Bash, RN  S Name/Age/Gender Kim Wong 43 y.o. female Room/Bed: WA11/WA11  Code Status   Code Status: Not on file  Home/SNF/Other Home Patient oriented to: self, place, time and situation Is this baseline? Yes   Triage Complete: Triage complete  Chief Complaint ABD PAIN  Triage Note Hx of gallstones, has been manageable, today the pain got much worse and she has vomited several times. Pt was able to take some vicodin prior to arrival which helped a bit with the pain.   Allergies No Known Allergies  Level of Care/Admitting Diagnosis ED Disposition    ED Disposition Condition Comment   Admit  Hospital Area: [facility] [[id number]]  Level of Care: Med-Surg [16]  Diagnosis: Cholecystitis with cholelithiasis [950932]  Admitting Physician: Griselda Miner [[id number]]  Attending Physician: CCS, MD [[id number]]  PT Class (Do Not Modify): Observation [104]  PT Acc Code (Do Not Modify): Observation [10022]       B Medical/Surgery History Past Medical History:  Diagnosis Date  . Gallstones    History reviewed. No pertinent surgical history.   A IV Location/Drains/Wounds Patient Lines/Drains/Airways Status   Active Line/Drains/Airways    Name:   Placement date:   Placement time:   Site:   Days:   Peripheral IV 01/04/19 Right Hand   01/04/19    1935    Hand   1          Intake/Output Last 24 hours No intake or output data in the 24 hours ending 01/05/19 0016  Labs/Imaging Results for orders placed or performed during the hospital encounter of 01/04/19 (from the past 48 hour(s))  Urinalysis, Routine w reflex microscopic     Status: Abnormal   Collection Time: 01/04/19  7:04 PM  Result Value Ref Range   Color, Urine YELLOW YELLOW   APPearance CLEAR CLEAR   Specific Gravity, Urine 1.015 1.005 - 1.030   pH 8.5 (H) 5.0 - 8.0   Glucose, UA NEGATIVE NEGATIVE mg/dL   Hgb urine dipstick NEGATIVE  NEGATIVE   Bilirubin Urine NEGATIVE NEGATIVE   Ketones, ur NEGATIVE NEGATIVE mg/dL   Protein, ur NEGATIVE NEGATIVE mg/dL   Nitrite NEGATIVE NEGATIVE   Leukocytes,Ua TRACE (A) NEGATIVE    Comment: Performed at [facility], [address] [place], [place] [address]  Pregnancy, urine     Status: None   Collection Time: 01/04/19  7:04 PM  Result Value Ref Range   Preg Test, Ur NEGATIVE NEGATIVE    Comment:        THE SENSITIVITY OF THIS METHODOLOGY IS >20 mIU/mL. Performed at [facility], [place], [address] [place], [place] [address]   Urinalysis, Microscopic (reflex)     Status: Abnormal   Collection Time: 01/04/19  7:04 PM  Result Value Ref Range   RBC / HPF 0-5 0 - 5 RBC/hpf   WBC, UA 6-10 0 - 5 WBC/hpf   Bacteria, UA MANY (A) NONE SEEN   Squamous Epithelial / LPF 0-5 0 - 5   Mucus PRESENT     Comment: Performed at [facility], [address] [place], [place] [address]  Lipase, blood     Status: None   Collection Time: 01/04/19  7:37 PM  Result Value Ref Range   Lipase 20 11 - 51 U/L    Comment: Performed at [facility], [address]  [place], [place] [address]  Comprehensive metabolic panel     Status: Abnormal   Collection Time: 01/04/19  7:37 PM  Result Value Ref Range   Sodium 134 (L) 135 - 145 mmol/L   Potassium 3.4 (L) 3.5 - 5.1 mmol/L   Chloride 105 98 - 111 mmol/L   CO2 21 (L) 22 - 32 mmol/L   Glucose, Bld 150 (H) 70 - 99 mg/dL   BUN 10 6 - 20 mg/dL   Creatinine, Ser 5.32 0.44 - 1.00 mg/dL   Calcium 8.9 8.9 - 02.3 mg/dL   Total Protein 7.5 6.5 - 8.1 g/dL   Albumin 3.6 3.5 - 5.0 g/dL   AST 343 (H) 15 - 41 U/L   ALT 70 (H) 0 - 44 U/L   Alkaline Phosphatase 152 (H) 38 - 126 U/L   Total Bilirubin 0.7 0.3 - 1.2 mg/dL   GFR calc non Af Amer >60 >60 mL/min   GFR calc Af Amer >60 >60 mL/min   Anion gap 8 5 - 15    Comment: Performed at [facility], [address] [place], [place] [address]  CBC with  Differential     Status: Abnormal   Collection Time: 01/04/19  7:37 PM  Result Value Ref Range   WBC 10.7 (H) 4.0 - 10.5 K/uL   RBC 4.73 3.87 - 5.11 MIL/uL   Hemoglobin 12.0 12.0 - 15.0 g/dL   HCT 68.3 72.9 - 02.1 %   MCV 81.6 80.0 - 100.0 fL   MCH 25.4 (L) 26.0 - 34.0 pg   MCHC 31.1 30.0 - 36.0 g/dL   RDW 11.5 52.0 - 80.2 %   Platelets 305 150 - 400 K/uL   nRBC 0.0 0.0 - 0.2 %   Neutrophils Relative % 87 %   Neutro Abs 9.2 (H) 1.7 - 7.7 K/uL   Lymphocytes Relative 10 %   Lymphs Abs 1.1 0.7 - 4.0 K/uL   Monocytes Relative 3 %   Monocytes Absolute 0.3 0.1 - 1.0 K/uL   Eosinophils Relative 0 %   Eosinophils Absolute 0.0 0.0 - 0.5 K/uL   Basophils Relative 0 %   Basophils Absolute 0.0 0.0 - 0.1 K/uL   Immature Granulocytes 0 %   Abs Immature Granulocytes 0.03 0.00 - 0.07 K/uL    Comment: Performed at [facility], [address] [place], [place] [address]   US Abdomen Limited Ruq  Result Date: 01/04/2019 CLINICAL DATA:  Epigastric and right upper quadrant pain today. Nausea and vomiting. Known gallstones. EXAM: ULTRASOUND ABDOMEN LIMITED RIGHT UPPER QUADRANT COMPARISON:  None. FINDINGS: Gallbladder: Large shadowing stone in the gallbladder fundus measuring 2.6 cm. Mild gallbladder wall thickening of 3 mm. Small amount pericholecystic fluid. Positive sonographic Murphy sign noted by sonographer. Common bile duct: Diameter: 2 mm. Liver: No focal lesion identified. Within normal limits in parenchymal echogenicity. Portal vein is patent on color Doppler imaging with normal direction of blood flow towards the liver. IMPRESSION: Gallstone in the fundus with mild gallbladder wall thickening, small amount pericholecystic fluid, and positive sonographic Murphy sign. Sonographic findings consistent with acute cholecystitis. No biliary dilatation. Electronically Signed   By: Narda Rutherford M.D.   On: 01/04/2019 20:18    Pending Labs [employer] (From admission, onward)    Start      Ordered   Signed and Held  HIV antibody (Routine Testing)  Once,   R     Signed and [employer] and  Held  Comprehensive metabolic panel  Tomorrow morning,   R     Signed and Held   Signed and Held  CBC  Tomorrow morning,   R     Signed and Held          Vitals/Pain Today's Vitals   01/04/19 2035 01/04/19 2147 01/04/19 2226 01/05/19 0000  BP: (!) 154/102  (!) 160/92 (!) 173/73  Pulse: 88  85 85  Resp: Temp:   98.3 F (36.8 C) 98.4 F (36.9 C)  TempSrc:   Oral Oral  SpO2: 99%  99% 98%  Weight:      Height:      PainSc:  0-No pain      Isolation Precautions No active isolations  Medications Medications  fentaNYL (SUBLIMAZE) injection 100 mcg (0 mcg Intravenous Hold 01/04/19 2147)  0.9 %  sodium chloride infusion ( Intravenous Stopped 01/04/19 2142)  ondansetron (ZOFRAN) injection 4 mg (4 mg Intravenous Given 01/04/19 2044)  cefTRIAXone (ROCEPHIN) 2 g in sodium chloride 0.9 % 100 mL IVPB (0 g Intravenous Stopped 01/04/19 2142)    Mobility walks Low fall risk   Focused Assessments    R Recommendations: See Admitting Provider Note  Report given to:   Additional Notes:

## 2019-01-05 NOTE — Interval H&P Note (Signed)
History and Physical Interval Note:  01/05/2019 7:55 AM  Kim Wong  has presented today for surgery, with the diagnosis of Acute cholecystitis.  The various methods of treatment have been discussed with the patient and family. After consideration of risks, benefits and other options for treatment, the patient has consented to  Procedure(s): LAPAROSCOPIC CHOLECYSTECTOMY WITH INTRAOPERATIVE CHOLANGIOGRAM (N/A) as a surgical intervention.  The patient's history has been reviewed, patient examined, no change in status, stable for surgery.  I have reviewed the patient's chart and labs.  Questions were answered to the patient's satisfaction.     Chevis Pretty III

## 2019-01-05 NOTE — Anesthesia Postprocedure Evaluation (Signed)
Anesthesia Post Note  Patient: Kim Wong  Procedure(s) Performed: LAPAROSCOPIC CHOLECYSTECTOMY WITH INTRAOPERATIVE CHOLANGIOGRAM (N/A )     Patient location during evaluation: PACU Anesthesia Type: General Level of consciousness: awake and alert Pain management: pain level controlled Vital Signs Assessment: post-procedure vital signs reviewed and stable Respiratory status: spontaneous breathing, nonlabored ventilation, respiratory function stable and patient connected to nasal cannula oxygen Cardiovascular status: blood pressure returned to baseline and stable Postop Assessment: no apparent nausea or vomiting Anesthetic complications: no    Last Vitals:  Vitals:   01/05/19 1030 01/05/19 1045  BP: (!) 142/86 139/76  Pulse: 70 70  Resp: 11 15  Temp:  36.6 C  SpO2: 100% 100%    Last Pain:  Vitals:   01/05/19 1045  TempSrc:   PainSc: Asleep                 Lauraine Crespo L Karina Nofsinger

## 2019-01-06 LAB — HIV ANTIBODY (ROUTINE TESTING W REFLEX): HIV Screen 4th Generation wRfx: NONREACTIVE

## 2019-01-07 ENCOUNTER — Encounter (HOSPITAL_COMMUNITY): Payer: Self-pay | Admitting: General Surgery

## 2019-01-14 ENCOUNTER — Ambulatory Visit: Payer: PRIVATE HEALTH INSURANCE | Admitting: Mental Health

## 2019-01-21 NOTE — Discharge Summary (Signed)
[facility]/Trauma Discharge Summary   Patient ID: Kim Wong MRN: [id number] DOB/AGE: 10/20/1975 43 y.o.  Admit date: 01/04/2019 Discharge date: 01/05/2019  Admitting Diagnosis: Acute cholecystitis with cholelithiasis  Discharge Diagnosis Patient Active Problem List   Diagnosis Date Noted  . Cholecystitis with cholelithiasis 01/04/2019    Consultants none  Imaging: No results found.  Procedures Dr. Carolynne Edouard 01/05/19) - Laparoscopic Cholecystectomy with IOC  HPI: The patient is a 43 year old white female who presents with right upper quadrant pain that started yesterday.  She states that she has known she has had gallstones for at least a couple of years.  She describes the pain is severe.  The pain is been associated with an episode of nausea and vomiting.  She says that she is starting to feel better now.  She denies any fevers or chills.  Her ultrasound showed a large stone in the fundus of the gallbladder with some gallbladder wall thickening.  2 of her 4 liver functions were elevated  Hospital Course:  Workup showed Acute cholecystitis with cholelithiasis.  Patient was admitted and underwent procedure listed above.  Tolerated procedure well and was transferred to the floor.  Diet was advanced as tolerated.  On POD#0, the patient was voiding well, tolerating diet, ambulating well, pain well controlled, vital signs stable, incisions c/d/i and felt stable for discharge home.  Patient will follow up as outlined below and knows to call with questions or concerns.     I did not see nor take part in this patient's care. All the information provided in this discharge summary was taken from the patient's EMR. This discharge summary was written at the request of Dr. Derrell Lolling.   Physical Exam: See notes from Dr. Carolynne Edouard  Allergies as of 01/05/2019   No Known Allergies     Medication List    STOP taking these medications   HYDROcodone-acetaminophen 5-325 MG  tablet Commonly known as:  NORCO/VICODIN        Follow-up Information    [facility] Schedule an appointment as soon as possible for a visit in 2 week(s).   Specialty:  General Surgery Contact information: [address] [place] [address] [phone]          Signed: Joyce Copa [facility] 01/21/2019, 2:51 PM Pager: [phone] Consults: [phone] Mon-Fri 7:00 am-4:30 pm Sat-Sun 7:00 am-11:30 am

## 2019-11-29 IMAGING — US ULTRASOUND ABDOMEN LIMITED
1 series · 14 of 25 positions shown · non-contrast
Comparison: None.

CLINICAL DATA: Epigastric and right upper quadrant pain today.
Nausea and vomiting. Known gallstones.

EXAM:
ULTRASOUND ABDOMEN LIMITED RIGHT UPPER QUADRANT

[Series 1: ultrasound abdomen limited · 14 of 40 slices shown]
[im 1/40]
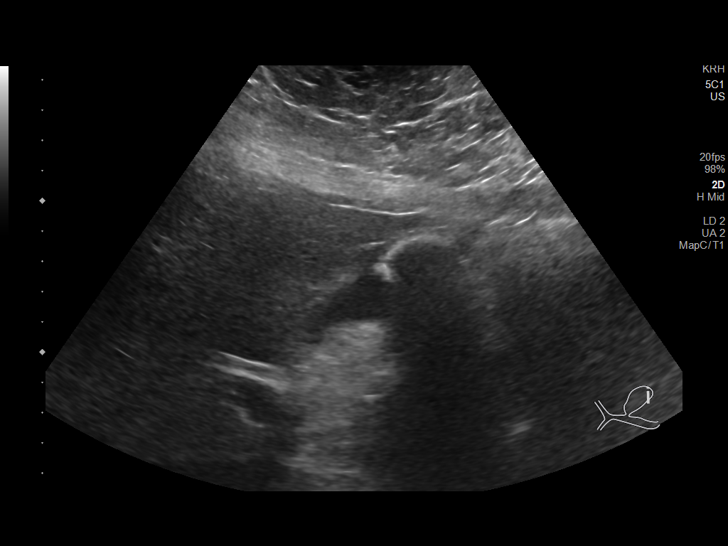
[im 4/40]
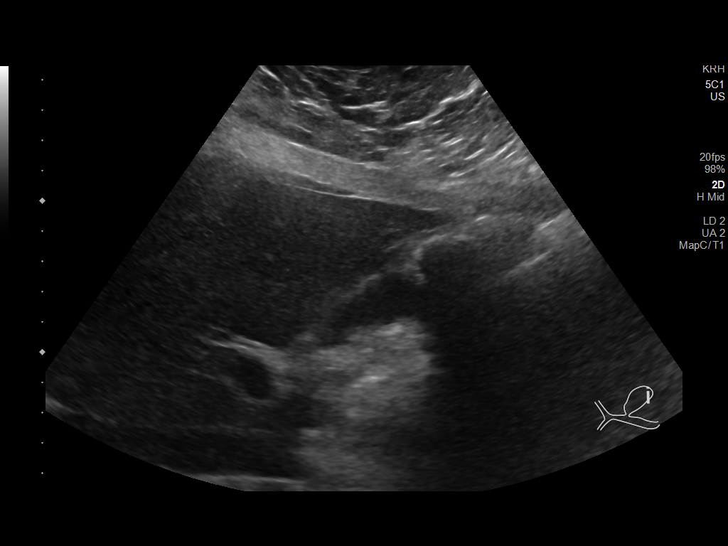
[im 7/40]
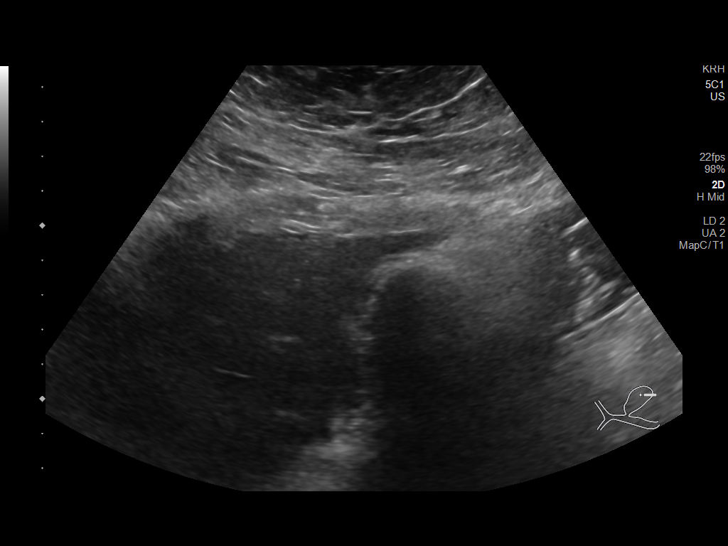
[im 10/40]
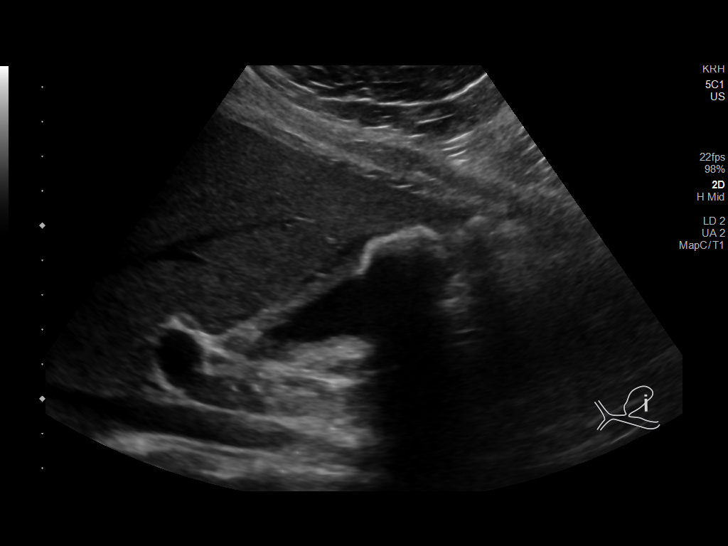
[im 14/40]
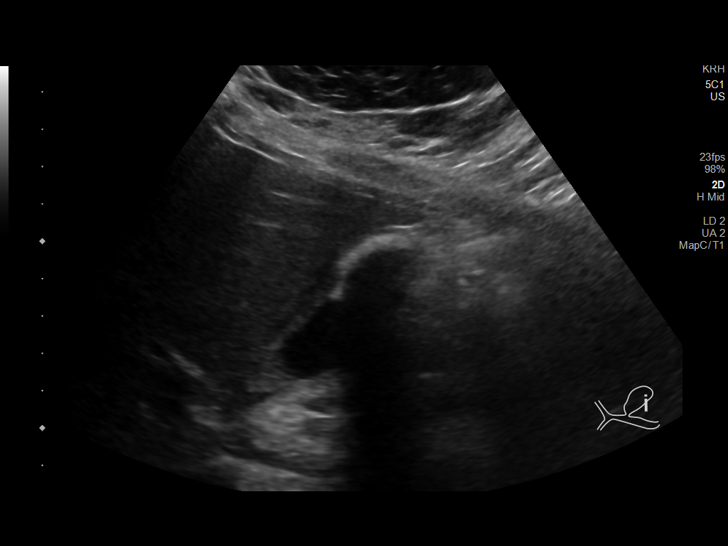
[im 15/40]
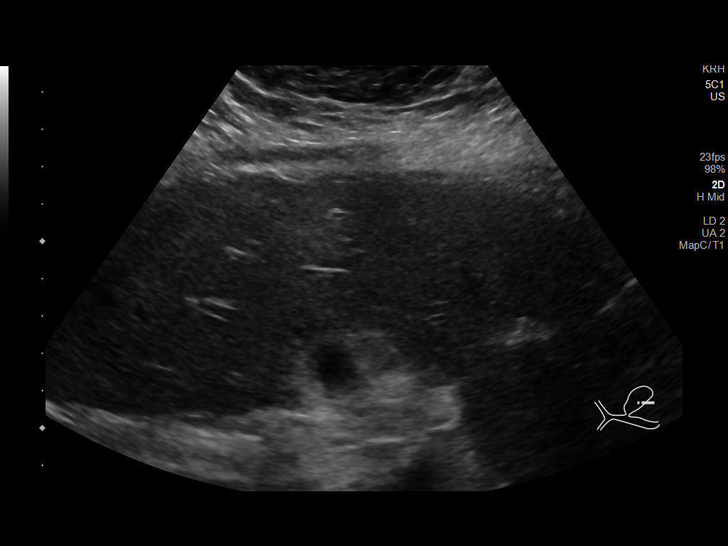
[im 18/40]
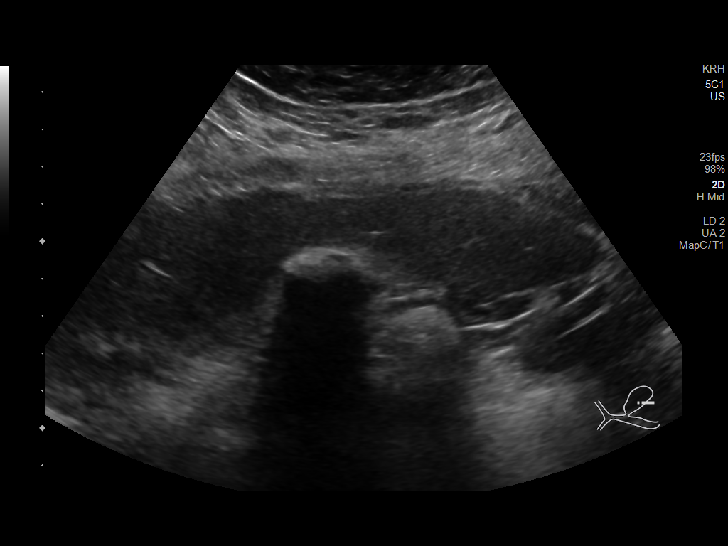
[im 22/40]
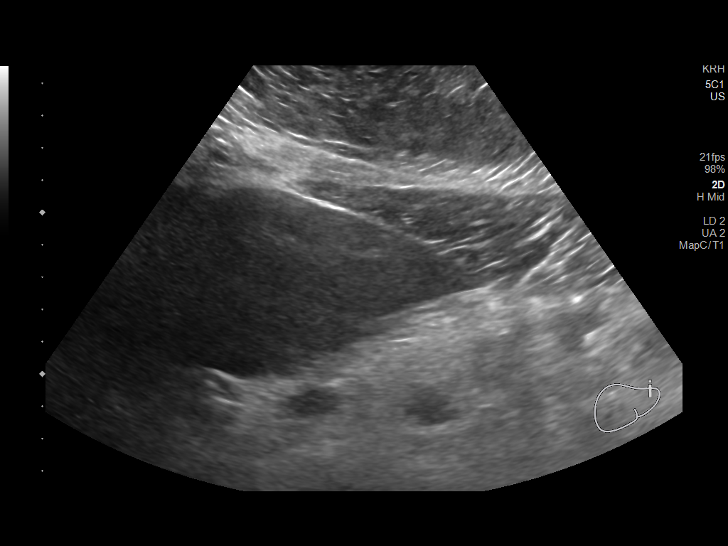
[im 25/40]
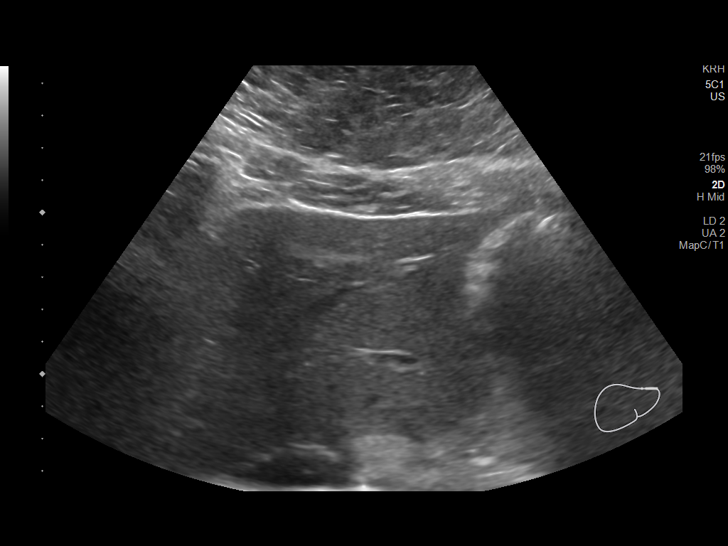
[im 27/40]
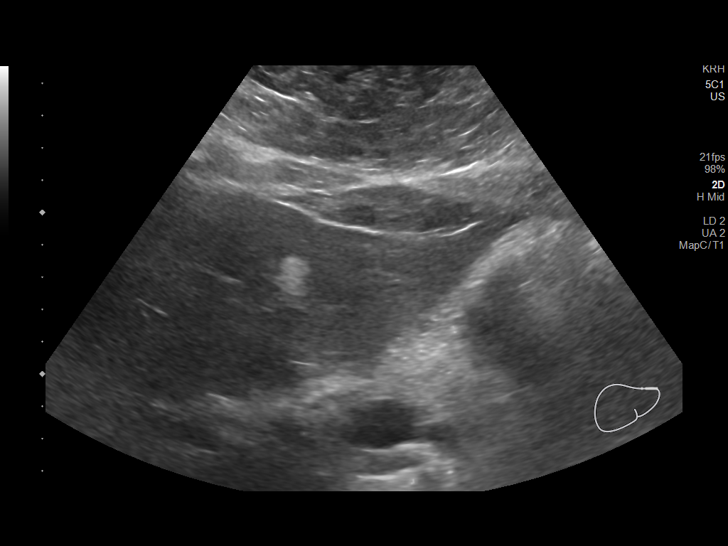
[im 30/40]
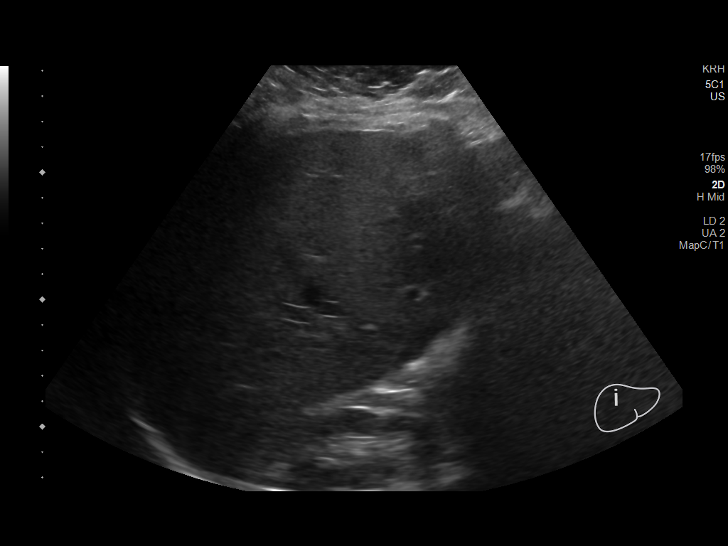
[im 33/40]
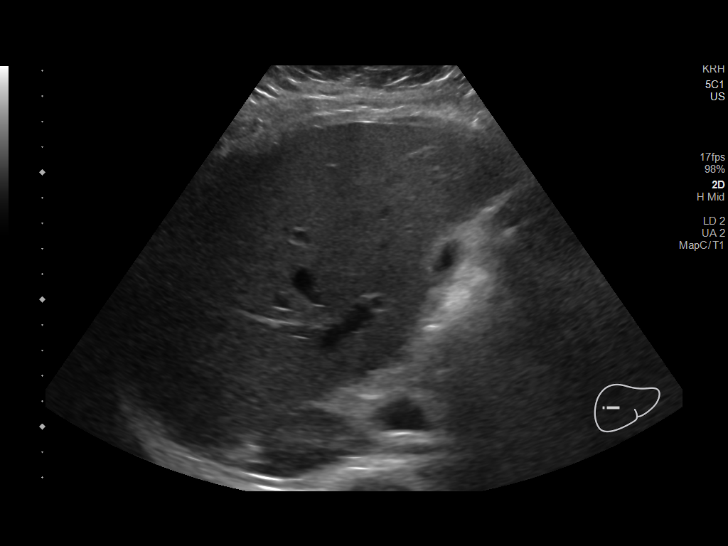
[im 36/40]
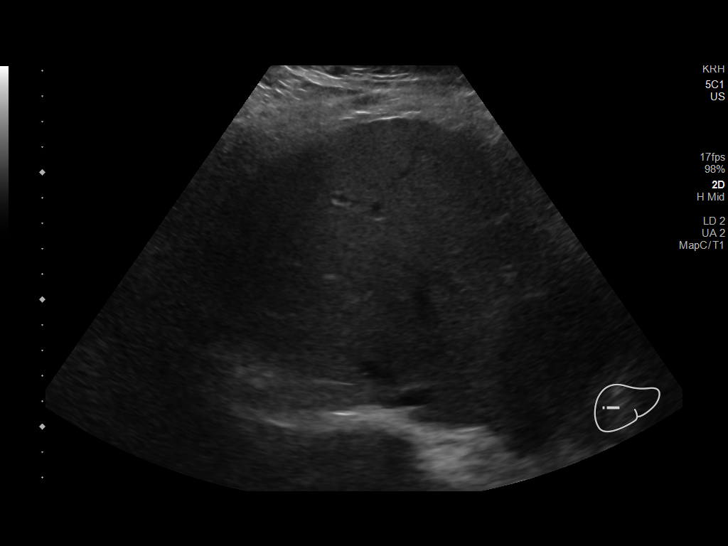
[im 40/40]
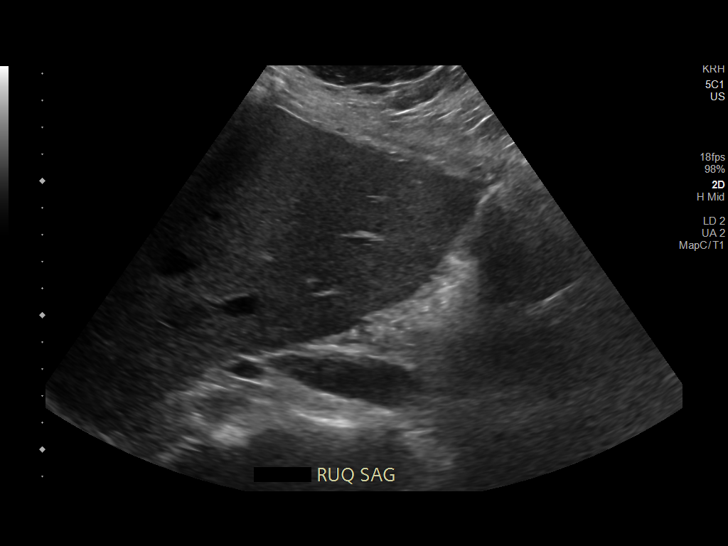

[14 of 25 positions shown; findings below may reference images not displayed]

FINDINGS: Gallbladder:

Large shadowing stone in the gallbladder fundus measuring 2.6 cm.
Mild gallbladder wall thickening of 3 mm. Small amount
pericholecystic fluid. Positive sonographic Murphy sign noted by
sonographer.

Common bile duct:

Diameter: 2 mm.

Liver:

No focal lesion identified. Within normal limits in parenchymal
echogenicity. Portal vein is patent on color Doppler imaging with
normal direction of blood flow towards the liver.
IMPRESSION: Gallstone in the fundus with mild gallbladder wall thickening, small
amount pericholecystic fluid, and positive sonographic Murphy sign.
Sonographic findings consistent with acute cholecystitis. No biliary
dilatation.

## 2019-11-30 IMAGING — RF INTRAOPERATIVE CHOLANGIOGRAM
1 series · 4 of 4 positions shown · non-contrast
Comparison: Ultrasound January 04, 2019.

CLINICAL DATA: Cholecystitis.

EXAM:
INTRAOPERATIVE CHOLANGIOGRAM
TECHNIQUE: Cholangiographic images from the C-arm fluoroscopic device were
submitted for interpretation post-operatively. Please see the
procedural report for the amount of contrast and the fluoroscopy
time utilized.
Radiation exposure index: 17.66 mGy.

[Series 1: run · 4 of 86 frames shown]
[frame 7/86]
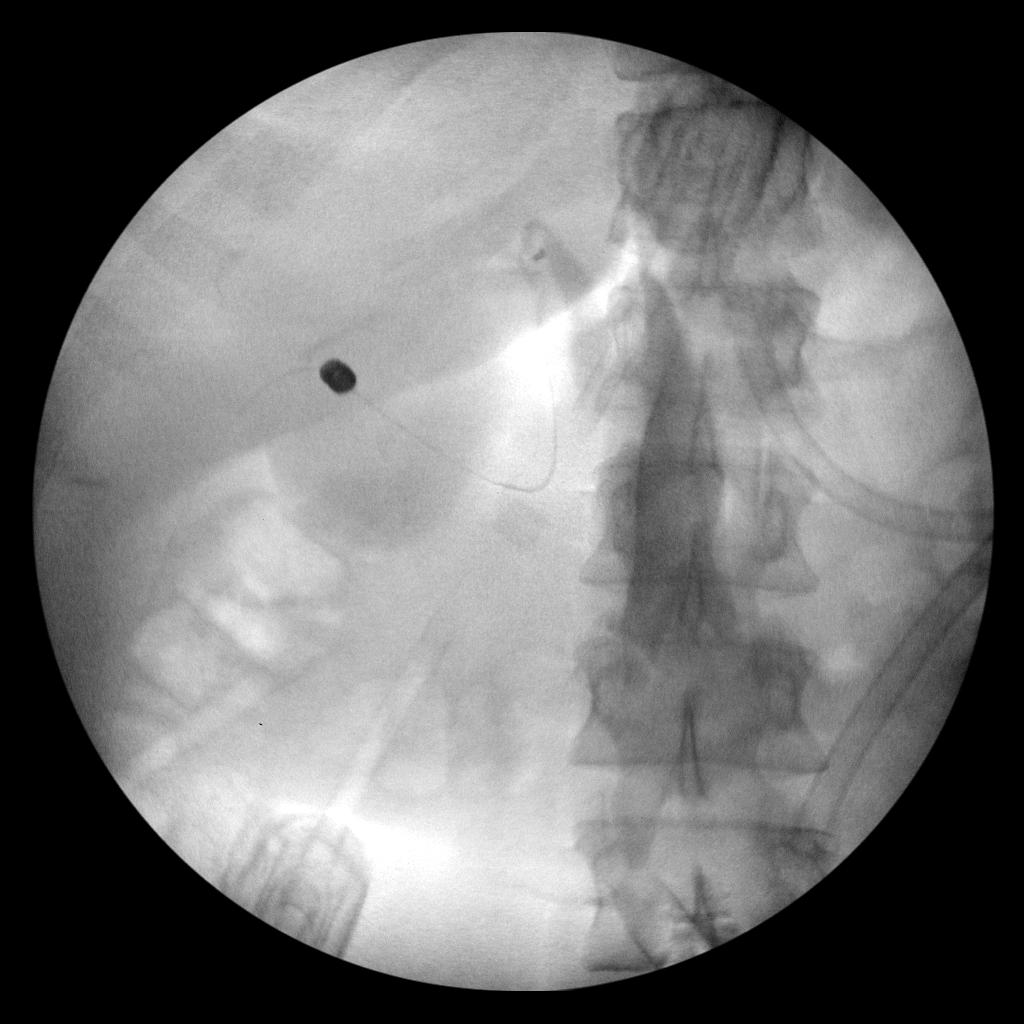
[frame 13/86]
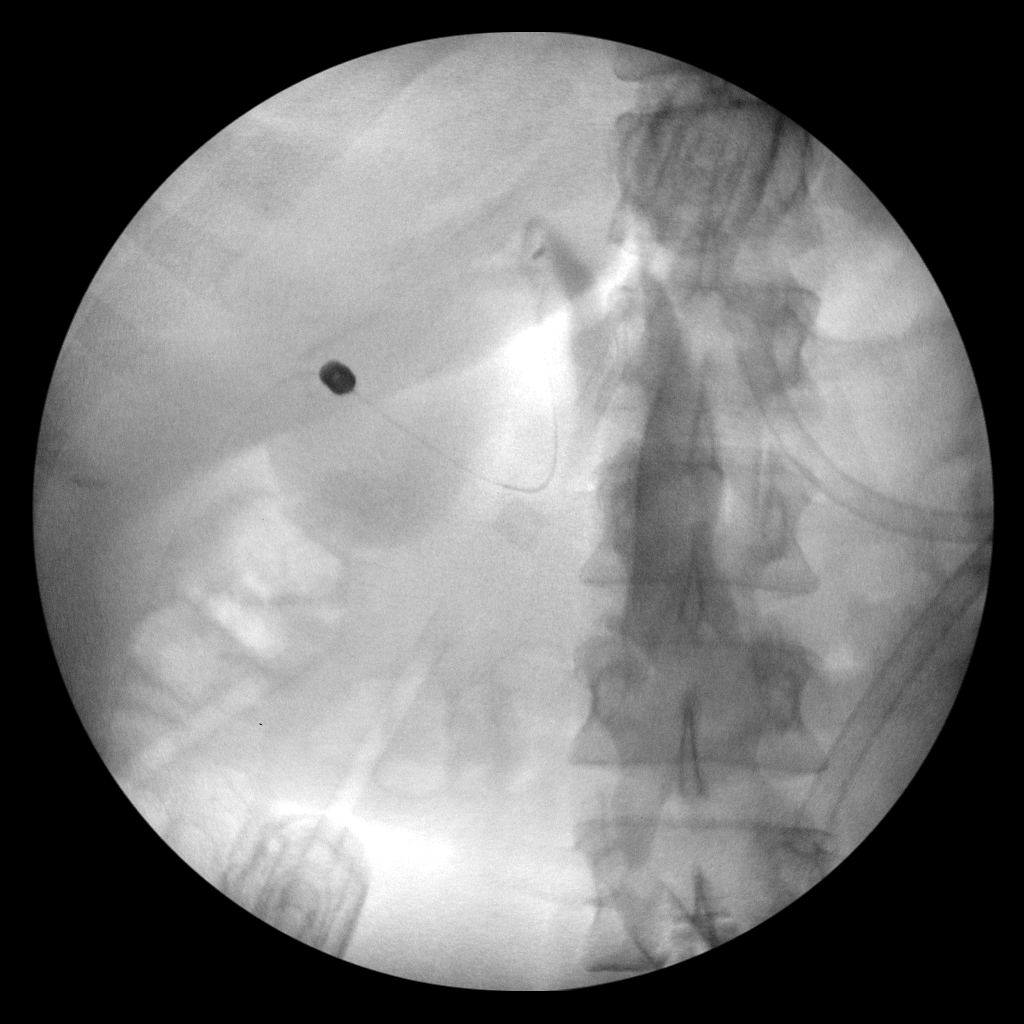
[frame 44/86]
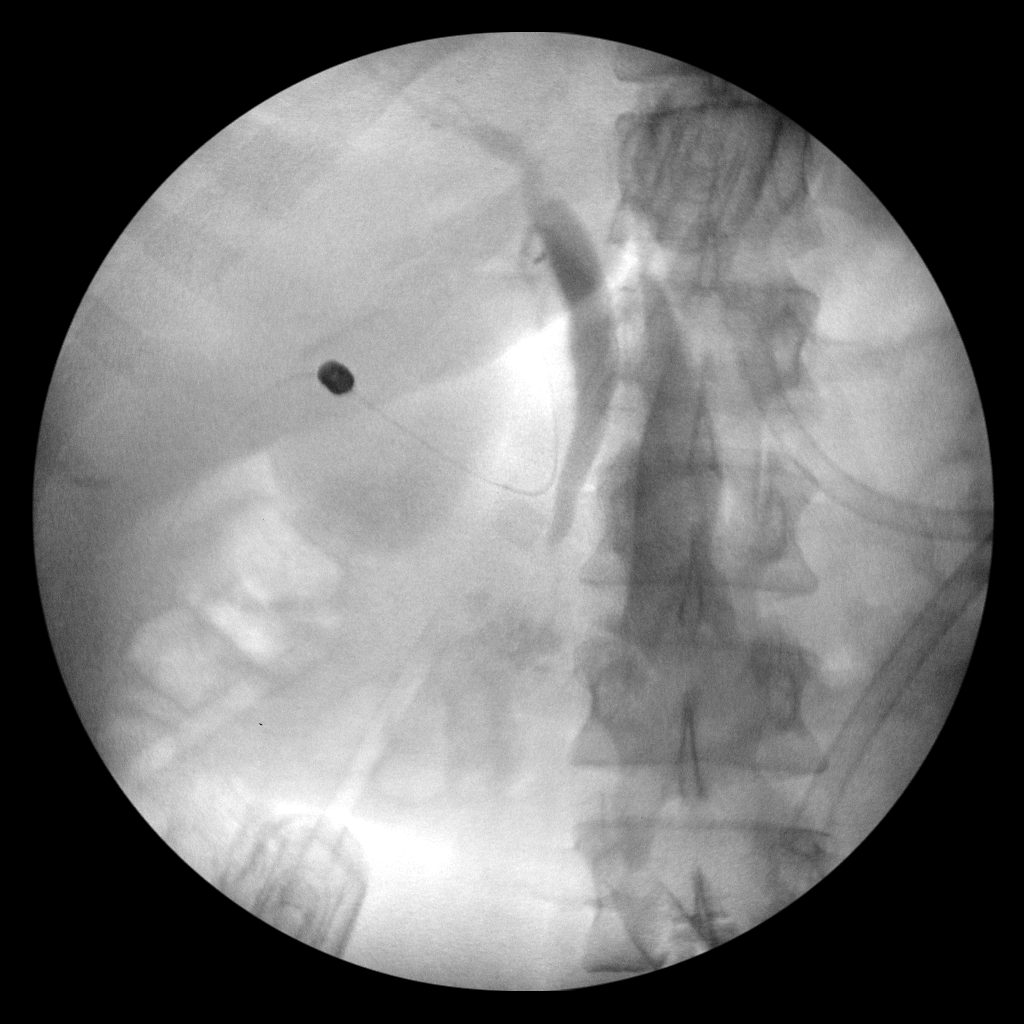
[frame 74/86]
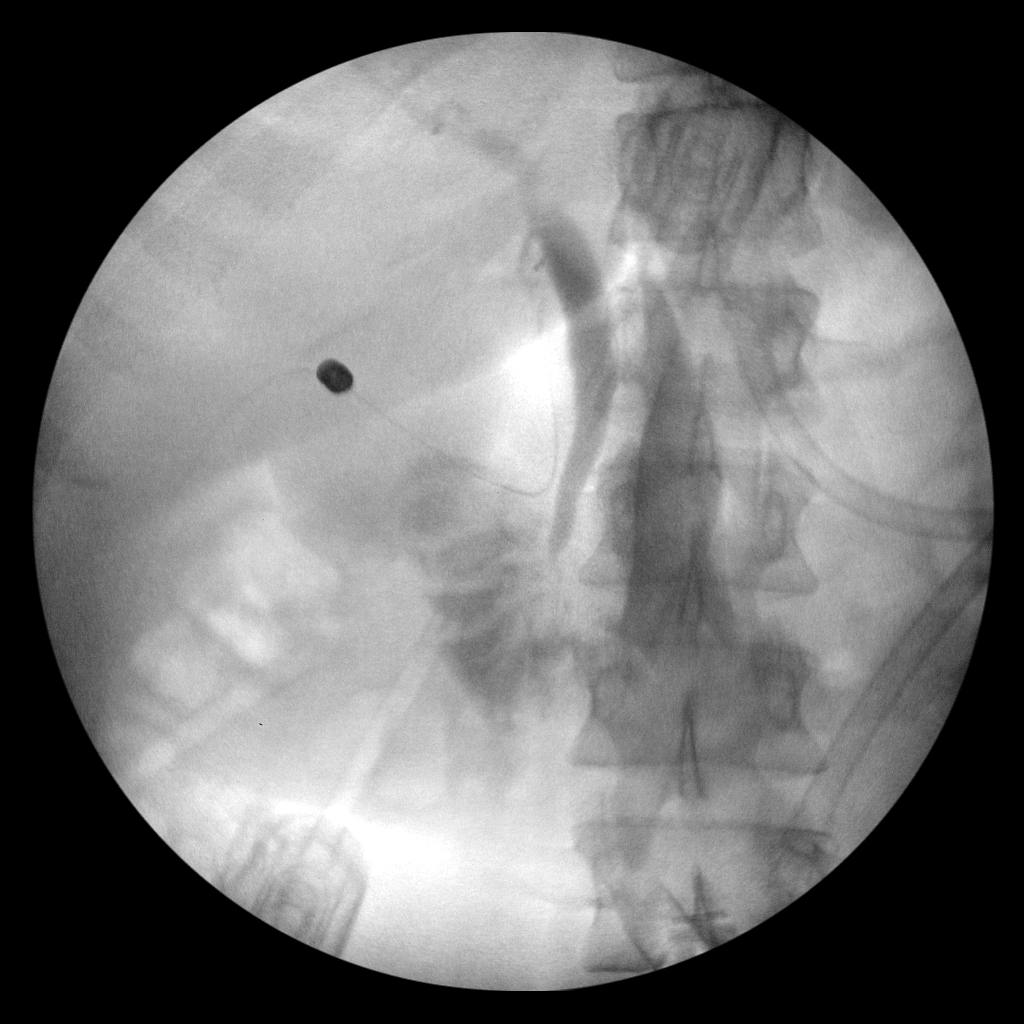

[4 of 4 positions shown; findings below may reference images not displayed]

FINDINGS: Contrast was injected through cannulated cystic duct remnant. No
definite filling defect is seen to suggest retained stone. Antegrade
flow into duodenum is noted.
IMPRESSION: Antegrade flow into duodenum is noted. No definite evidence of
common bile duct stone.
